# Patient Record
Sex: Female | Born: 1940 | Race: White | Hispanic: No | Smoking: Never smoker
Health system: Southern US, Community
[De-identification: ages and names within clinical notes are randomized; demographics above are authoritative.]

## PROBLEM LIST (undated history)

## (undated) DIAGNOSIS — J45909 Unspecified asthma, uncomplicated: Secondary | ICD-10-CM

## (undated) HISTORY — PX: TOTAL HIP ARTHROPLASTY: SHX124

## (undated) HISTORY — PX: ABDOMINAL HYSTERECTOMY: SHX81

## (undated) HISTORY — PX: CARDIAC SURGERY: SHX584

---

## 2019-06-14 ENCOUNTER — Emergency Department (HOSPITAL_COMMUNITY): Payer: Medicare Other

## 2019-06-14 ENCOUNTER — Other Ambulatory Visit: Payer: Self-pay

## 2019-06-14 ENCOUNTER — Emergency Department (HOSPITAL_COMMUNITY)
Admission: EM | Admit: 2019-06-14 | Discharge: 2019-06-14 | Disposition: A | Discharge status: Home / Self Care | Payer: Medicare Other | Attending: Emergency Medicine | Admitting: Emergency Medicine

## 2019-06-14 ENCOUNTER — Encounter (HOSPITAL_COMMUNITY): Payer: Self-pay

## 2019-06-14 DIAGNOSIS — Z79899 Other long term (current) drug therapy: Secondary | ICD-10-CM | POA: Diagnosis not present

## 2019-06-14 DIAGNOSIS — Z96649 Presence of unspecified artificial hip joint: Secondary | ICD-10-CM | POA: Insufficient documentation

## 2019-06-14 DIAGNOSIS — M25532 Pain in left wrist: Secondary | ICD-10-CM | POA: Diagnosis not present

## 2019-06-14 DIAGNOSIS — W19XXXA Unspecified fall, initial encounter: Secondary | ICD-10-CM

## 2019-06-14 DIAGNOSIS — W07XXXA Fall from chair, initial encounter: Secondary | ICD-10-CM | POA: Insufficient documentation

## 2019-06-14 HISTORY — DX: Unspecified asthma, uncomplicated: J45.909

## 2019-06-14 MED ORDER — HYDROCODONE-ACETAMINOPHEN 5-325 MG PO TABS: 1.0000 | ORAL_TABLET | Freq: Four times a day (QID) | ORAL | 0 refills | Status: AC | PRN

## 2019-06-14 NOTE — ED Notes (Signed)
Pt given Ice Pack

## 2019-06-14 NOTE — Progress Notes (Signed)
Orthopedic Tech Progress Note Patient Details:  Sharon Choi 1940-10-09 [id number]  Ortho Devices Type of Ortho Device: Arm sling, Thumb spica splint Splint Material: Fiberglass Ortho Device/Splint Location: lue Ortho Device/Splint Interventions: Ordered, Application, Adjustment   Post Interventions Patient Tolerated: Well Instructions Provided: Care of device, Adjustment of device   Trinna Post 06/14/2019, 10:02 PM

## 2019-06-14 NOTE — Discharge Instructions (Signed)
Your x-rays today did not show any definite signs of fracture.  However you have pain overlying the scaphoid bone.  We typically treat these conservatively as presumed fractures until you can follow-up with a PCP or orthopedist for repeat x-rays within 7 to 14 days.  1. Medications: You can take 1 to 2 tablets of Tylenol (350mg -1000mg  depending on the dose) every 6 hours as needed for pain.  Do not exceed 4000 mg of Tylenol daily.  If your pain persists you can take a dose of ibuprofen in between doses of Tylenol.  I usually recommend 400 to 600 mg of ibuprofen every 6 hours.  Take this with food to avoid upset stomach issues.  You can take hydrocodone as needed for severe pain but do not drive, drink alcohol, or operate heavy machinery while taking this medication as it can cause drowsiness.  Be aware this medicine also contains Tylenol. Ask the pharmacist about this medication before you fill it to ensure you will not have a reaction to it.  2. Treatment: rest, ice 20 minutes at a time, elevate.  Wear the splint at all times.  Keep it clean and dry. 3. Follow Up: Please followup with orthopedics as directed or your PCP for reevaluation and repeat x-rays in 7 to 14 days; Please return to the ER for worsening symptoms or other concerns such as worsening swelling, redness of the skin, fevers, loss of pulses, or loss of feeling

## 2019-06-14 NOTE — ED Triage Notes (Signed)
Pt presents with c/o left hand/forearm injury. Pt fell off of a chair with wheels, catching herself with her left hand. Hand/foearm is wrapped right now but pt believes she has multiple breaks.

## 2019-06-14 NOTE — ED Provider Notes (Signed)
[facility] DEPT Provider Note   CSN: [id number] Arrival date & time: 06/14/19  1827     History Chief Complaint  Patient presents with  . Arm Injury    Sharon Choi is a 79 y.o. female who has history of asthma presents for evaluation of acute onset, persistent left upper extremity pain secondary to injury at around 6 PM.  She reports that she was sitting in a chair with wheels quilting when a couple pieces of fabric fell to the ground.  She states that she leaned forward to grab the pieces of fabric when the chair slipped out from under her and she fell forward landing on her left upper extremity.  No head injury or loss of consciousness.  She notes constant pain worst around the left wrist but starts at around the shoulder and radiates to her fingertips.  Pain worsens with movement of the arm.  Denies numbness or tingling of the extremity.  She denies any headache, neck pain, chest pain, abdominal pain, nausea, or vomiting.  She is not on any blood thinners.  The history is provided by the patient.       Past Medical History:  Diagnosis Date  . Asthma     There are no problems to display for this patient.   Past Surgical History:  Procedure Laterality Date  . ABDOMINAL HYSTERECTOMY    . CARDIAC SURGERY    . TOTAL HIP ARTHROPLASTY       OB History   No obstetric history on file.     No family history on file.  Social History   Tobacco Use  . Smoking status: Never Smoker  . Smokeless tobacco: Never Used  Substance Use Topics  . Alcohol use: Never  . Drug use: Never    Home Medications Prior to Admission medications   Medication Sig Start Date End Date Taking? Authorizing Provider  diphenhydrAMINE (BENADRYL) 12.5 MG/5ML liquid Take 3.75 mg by mouth as needed for allergies.   Yes [provider]  ibuprofen (ADVIL) 200 MG tablet Take 200 mg by mouth every 6 (six) hours as needed for headache, mild pain or moderate pain.    Yes [provider]  loratadine (CLARITIN REDITABS) 10 MG dissolvable tablet Take 10 mg by mouth daily as needed for allergies.   Yes [provider]  propranolol ER (INDERAL LA) 80 MG 24 hr capsule Take 80 mg by mouth at bedtime.   Yes [provider]  HYDROcodone-acetaminophen (NORCO/VICODIN) 5-325 MG tablet Take 1 tablet by mouth every 6 (six) hours as needed for severe pain. 06/14/19   Rodell Perna A, PA-C    Allergies    Food, Penicillins, Sulfa antibiotics, Corn-containing products, Eggs or egg-derived products, Wheat bran, and Milk-related compounds  Review of Systems   Review of Systems  Physical Exam Updated Vital Signs BP (!) 144/84   Pulse 71   Temp 98.2 F (36.8 C) (Oral)   Resp 18   Ht 5\' 3"  (1.6 m)   Wt 81.6 kg   SpO2 99%   BMI 31.89 kg/m   Physical Exam Vitals and nursing note reviewed.  Constitutional:      General: She is not in acute distress.    Appearance: She is well-developed.  HENT:     Head: Normocephalic and atraumatic.     Comments: No Battle's signs, no raccoon's eyes, no rhinorrhea.  Eyes:     General:        Right eye: No  discharge.        Left eye: No discharge.     Conjunctiva/sclera: Conjunctivae normal.     Pupils: Pupils are equal, round, and reactive to light.  Neck:     Vascular: No JVD.     Trachea: No tracheal deviation.     Comments: No midline cervical spine tenderness, no deformity, crepitus, or step-off noted. Cardiovascular:     Rate and Rhythm: Normal rate and regular rhythm.     Pulses: Normal pulses.     Comments: 2+ radial pulses bilaterally Pulmonary:     Effort: Pulmonary effort is normal.     Breath sounds: Normal breath sounds.  Chest:     Chest wall: No tenderness.  Abdominal:     General: There is no distension.  Musculoskeletal:        General: Swelling and tenderness present.     Cervical back: Neck supple.     Comments: Swelling and tenderness overlying the left wrist.  She has  left anatomic snuffbox tenderness.  No crepitus noted.  5/5 strength of BUE major muscle groups.  Normal passive range of motion of the left shoulder.  She has some tenderness to palpation of the left acromioclavicular joint.  No erythema or warmth noted to the extremities.  Good grip strength bilaterally.  Skin:    General: Skin is warm and dry.     Capillary Refill: Capillary refill takes less than 2 seconds.     Findings: No erythema.  Neurological:     General: No focal deficit present.     Mental Status: She is alert and oriented to person, place, and time.     Cranial Nerves: No cranial nerve deficit.     Sensory: No sensory deficit.     Motor: No weakness.     Comments: Fluent speech, no facial droop, sensation intact to light touch of bilateral upper extremities.  Psychiatric:        Behavior: Behavior normal.     ED Results / Procedures / Treatments   Labs (all labs ordered are listed, but only abnormal results are displayed) Labs Reviewed - No data to display  EKG None  Radiology DG Elbow Complete Left  Result Date: 06/14/2019 CLINICAL DATA:  Fall.  Pain EXAM: LEFT ELBOW - COMPLETE 3+ VIEW COMPARISON:  None. FINDINGS: There is no evidence of fracture, dislocation, or joint effusion. There is no evidence of arthropathy or other focal bone abnormality. Soft tissues are unremarkable. IMPRESSION: Negative. Electronically Signed   By: Charlett Nose M.D.   On: 06/14/2019 20:27   DG Wrist Complete Left  Result Date: 06/14/2019 CLINICAL DATA:  Fall and pain EXAM: LEFT WRIST - COMPLETE 3+ VIEW COMPARISON:  None. FINDINGS: There is no evidence of fracture or dislocation. Well corticated ossicle seen adjacent to the vertebral trapezium, likely from prior nonunited injury. There is no evidence of arthropathy or other focal bone abnormality. Soft tissues are unremarkable. IMPRESSION: No acute osseous abnormality. Electronically Signed   By: Jonna Clark M.D.   On: 06/14/2019 20:25   DG  Shoulder Left  Result Date: 06/14/2019 CLINICAL DATA:  Fall, pain EXAM: LEFT SHOULDER - 2+ VIEW COMPARISON:  None. FINDINGS: Degenerative changes in the [facility] joint with joint space narrowing and spurring. Glenohumeral joint is maintained. No acute bony abnormality. Specifically, no fracture, subluxation, or dislocation. Soft tissues are intact. 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 No acute bony abnormality. Electronically Signed   By: Charlett Nose M.D.  On: 06/14/2019 20:26   DG Hand Complete Left  Result Date: 06/14/2019 CLINICAL DATA:  Pain and fall EXAM: LEFT HAND - COMPLETE 3+ VIEW COMPARISON:  None. FINDINGS: There is no evidence of fracture or dislocation. 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 Small well corticated ossicle seen adjacent to the trapezium. There is diffuse osteopenia. No focal soft tissue swelling. IMPRESSION: No acute osseous abnormality. Electronically Signed   By: Jonna Clark M.D.   On: 06/14/2019 20:26    Procedures Procedures (including critical care time)  Medications Ordered in ED Medications - No data to display  ED Course  I have reviewed the triage vital signs and the nursing notes.  Pertinent labs & imaging results that were available during my care of the patient were reviewed by me and considered in my medical decision making (see chart for details).   MDM Rules/Calculators/A&P                      Patient presenting for evaluation of left upper extremity pain secondary to mechanical fall.  She is afebrile, vital signs are stable.  She is nontoxic in appearance.  She is neurovascularly intact.  Compartments are soft; no evidence of secondary skin infection or DVT.  No signs of serious head injury.  No midline spine tenderness.  Radiographs show no acute osseous abnormality, no definitive evidence of fracture or dislocation.  However she does have snuffbox tenderness on examination  so we will treat this as a presumed scaphoid fracture and place her in a thumb spica splint.  While in the ED she had improvement with application of ice and Ace wrap.  She has a history of intolerance to some narcotic pain medicines due to the binding agents.  We will send over a prescription for small amount of hydrocodone but she understands to speak to the pharmacist to discuss what binding agent was used for the specific tablets.  She understands to avoid these medications entirely if she may have a reaction to them.  We discussed appropriate use of narcotic pain medicines, NSAIDs, Tylenol.  She will follow-up with orthopedics on an outpatient basis for reevaluation and repeat imaging.  Discussed strict ED return precautions.  Final Clinical Impression(s) / ED Diagnoses Final diagnoses:  Fall, initial encounter  Left wrist pain    Rx / DC Orders ED Discharge Orders         Ordered    HYDROcodone-acetaminophen (NORCO/VICODIN) 5-325 MG tablet  Every 6 hours PRN     06/14/19 2126           Bennye Alm 06/14/19 2157    Raeford Razor, MD 06/16/19 [phone]

## 2019-06-14 NOTE — ED Notes (Signed)
X-ray at bedside

## 2019-08-06 ENCOUNTER — Ambulatory Visit: Payer: Medicare Other | Attending: Internal Medicine

## 2019-08-06 DIAGNOSIS — Z23 Encounter for immunization: Secondary | ICD-10-CM

## 2019-08-06 NOTE — Progress Notes (Signed)
   Covid-19 Vaccination Clinic  Name:  Sharon Choi    MRN: [id number] DOB: 04-20-1941  08/06/2019  [name] was observed post Covid-19 immunization for 30 minutes based on pre-vaccination screening without incident. She was provided with Vaccine Information Sheet and instruction to access the V-Safe system.   [name] was instructed to call 911 with any severe reactions post vaccine: Marland Kitchen Difficulty breathing  . Swelling of face and throat  . A fast heartbeat  . A bad rash all over body  . Dizziness and weakness   Immunizations Administered    Name Date Dose VIS Date Route   Pfizer COVID-19 Vaccine 08/06/2019  2:56 PM 0.3 mL 04/17/2019 Intramuscular   Manufacturer: [employer], [employer]   Lot: [id number]   NDC: [address]-9

## 2019-08-31 ENCOUNTER — Ambulatory Visit: Payer: Medicare Other | Attending: Internal Medicine

## 2019-08-31 DIAGNOSIS — Z23 Encounter for immunization: Secondary | ICD-10-CM

## 2019-08-31 NOTE — Progress Notes (Signed)
   Covid-19 Vaccination Clinic  Name:  Sharon Choi    MRN: [id number] DOB: 1940/10/10  08/31/2019  Sharon Choi was observed post Covid-19 immunization for 30 minutes based on pre-vaccination screening without incident. She was provided with Vaccine Information Sheet and instruction to access the V-Safe system.   Sharon Choi was instructed to call 911 with any severe reactions post vaccine: Marland Kitchen Difficulty breathing  . Swelling of face and throat  . A fast heartbeat  . A bad rash all over body  . Dizziness and weakness   Immunizations Administered    Name Date Dose VIS Date Route   Pfizer COVID-19 Vaccine 08/31/2019 10:43 AM 0.3 mL 07/01/2018 Intramuscular   Manufacturer: [employer], [employer]   Lot: [id number]   NDC: [address]-6

## 2020-11-10 IMAGING — CR DG WRIST COMPLETE 3+V*L*
4 series · 4 of 4 positions shown · non-contrast
Comparison: None.

CLINICAL DATA: Fall and pain

EXAM:
LEFT WRIST - COMPLETE 3+ VIEW

[x wrist pa left]
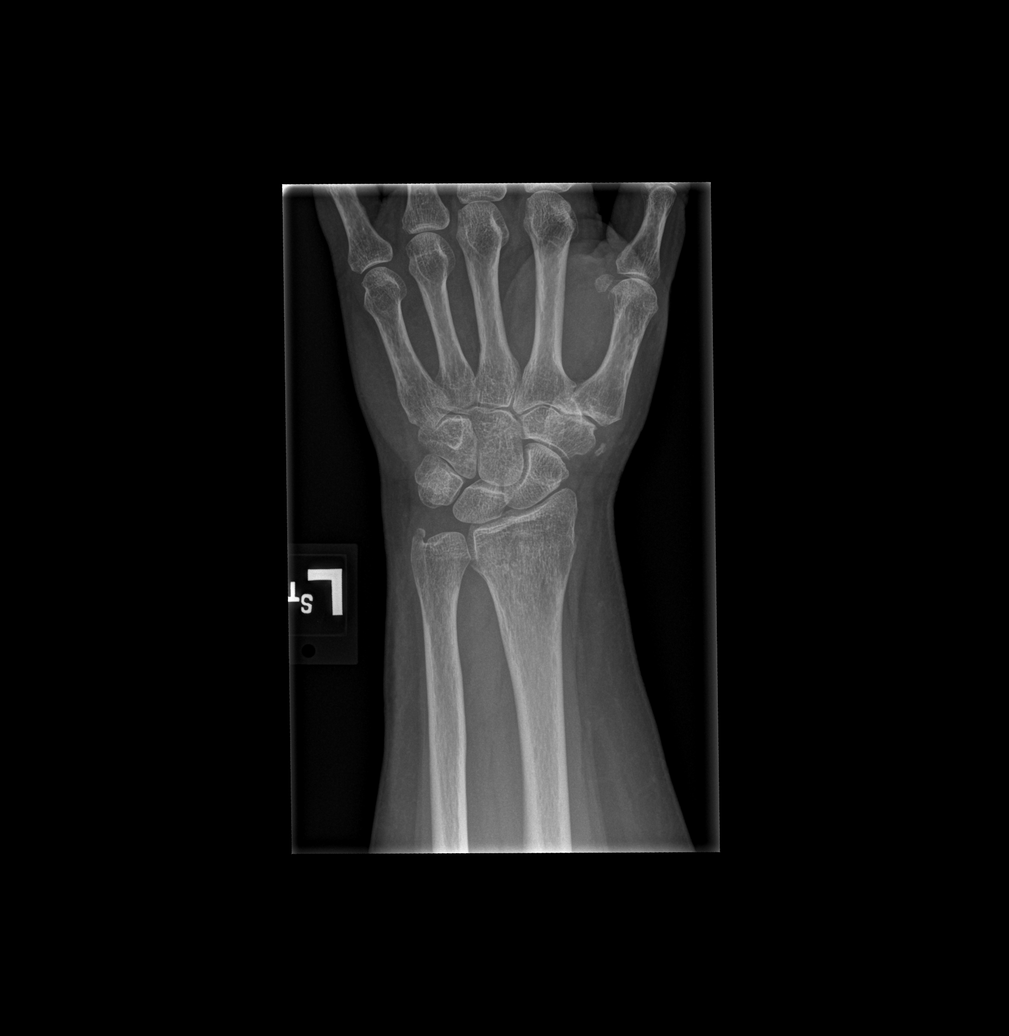

[x wrist obl left]
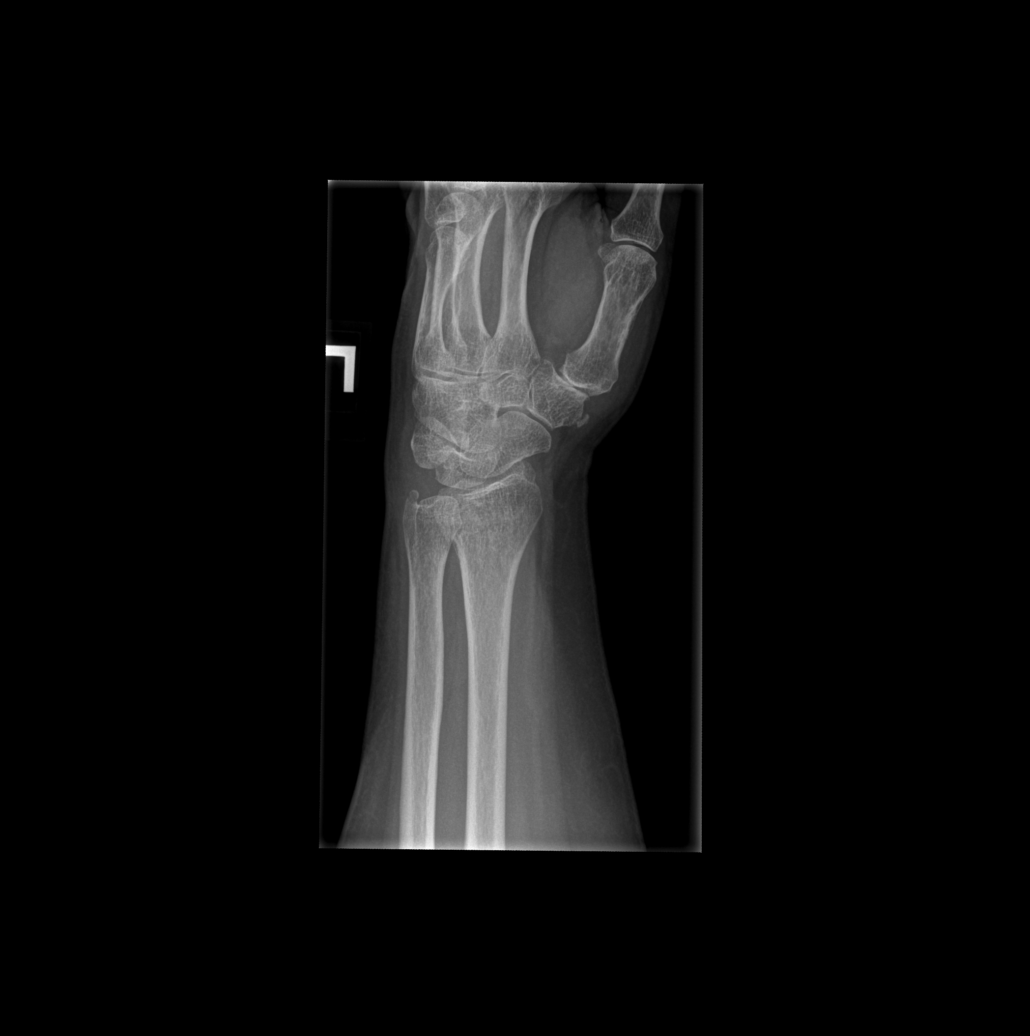

[x wrist lat left]
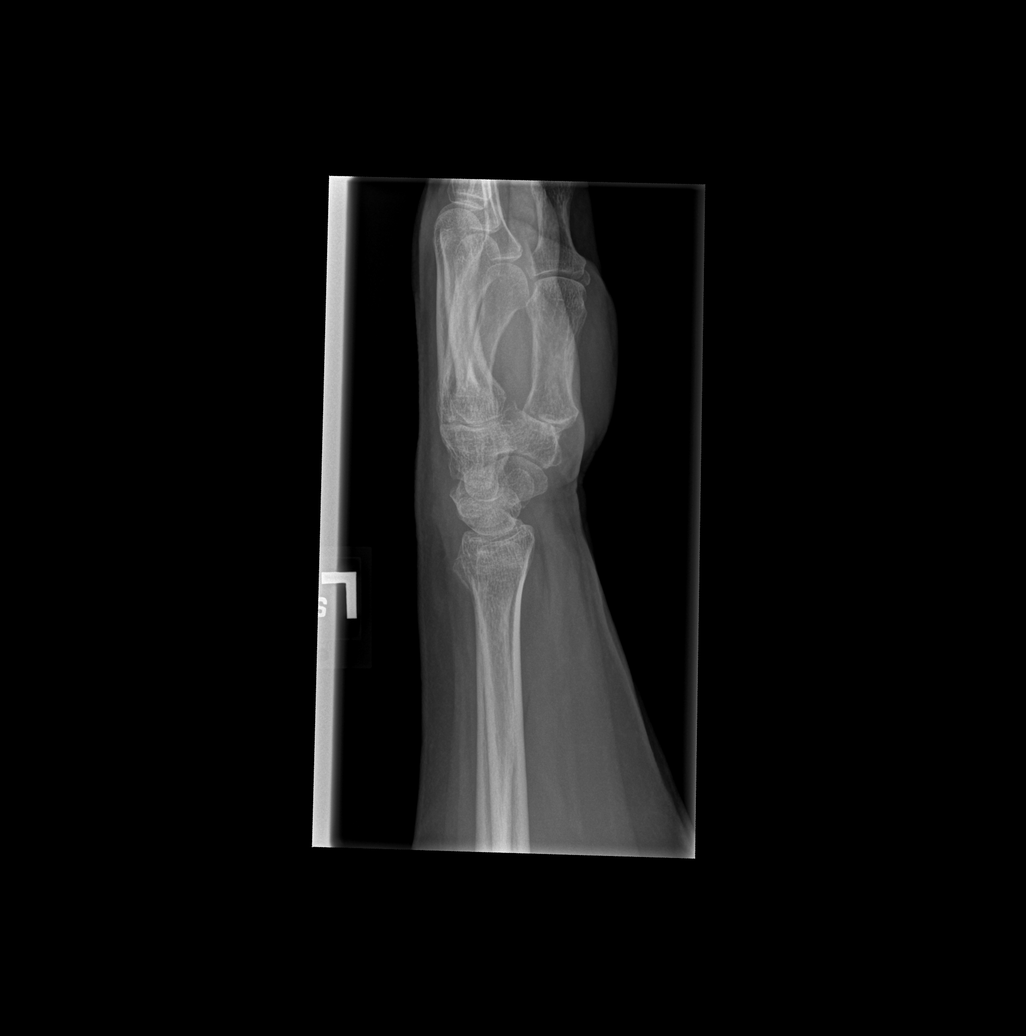

[x wrist navicular view left]
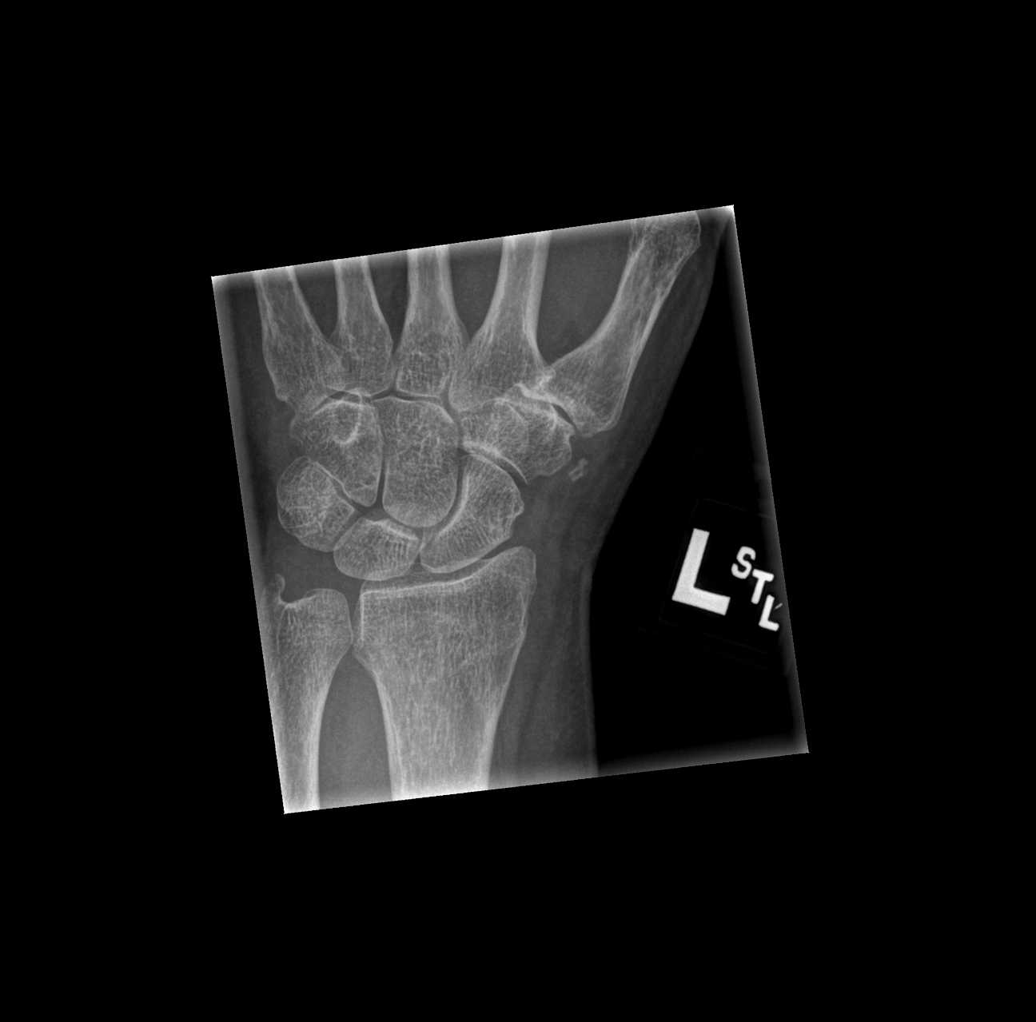

[4 of 4 positions shown; findings below may reference images not displayed]

FINDINGS: There is no evidence of fracture or dislocation. Well corticated
ossicle seen adjacent to the vertebral trapezium, likely from prior
nonunited injury. There is no evidence of arthropathy or other focal
bone abnormality. Soft tissues are unremarkable.
IMPRESSION: No acute osseous abnormality.
